# Patient Record
Sex: Female | Born: 1980 | Hispanic: No | Marital: Single | Smoking: Never smoker
Health system: Southern US, Community
[De-identification: ages and names within clinical notes are randomized; demographics above are authoritative.]

## PROBLEM LIST (undated history)

## (undated) ENCOUNTER — Inpatient Hospital Stay (HOSPITAL_COMMUNITY): Payer: Self-pay

## (undated) DIAGNOSIS — I10 Essential (primary) hypertension: Secondary | ICD-10-CM

## (undated) DIAGNOSIS — O139 Gestational [pregnancy-induced] hypertension without significant proteinuria, unspecified trimester: Secondary | ICD-10-CM

## (undated) HISTORY — PX: KNEE ARTHROSCOPY W/ ACL RECONSTRUCTION: SHX1858

## (undated) HISTORY — PX: BREAST LUMPECTOMY: SHX2

## (undated) HISTORY — PX: HERNIA REPAIR: SHX51

---

## 2003-06-07 ENCOUNTER — Other Ambulatory Visit: Admission: RE | Admit: 2003-06-07 | Discharge: 2003-06-07 | Payer: Self-pay | Admitting: Gynecology

## 2016-02-14 ENCOUNTER — Other Ambulatory Visit (HOSPITAL_COMMUNITY): Payer: Self-pay | Admitting: Obstetrics and Gynecology

## 2016-02-14 DIAGNOSIS — Z3689 Encounter for other specified antenatal screening: Secondary | ICD-10-CM

## 2016-02-14 DIAGNOSIS — O26843 Uterine size-date discrepancy, third trimester: Secondary | ICD-10-CM

## 2016-02-14 DIAGNOSIS — Z3A33 33 weeks gestation of pregnancy: Secondary | ICD-10-CM

## 2016-02-21 ENCOUNTER — Other Ambulatory Visit (HOSPITAL_COMMUNITY): Payer: Self-pay | Admitting: Obstetrics and Gynecology

## 2016-02-21 ENCOUNTER — Ambulatory Visit (HOSPITAL_COMMUNITY)
Admission: RE | Admit: 2016-02-21 | Discharge: 2016-02-21 | Disposition: A | Discharge status: Home / Self Care | Payer: BLUE CROSS/BLUE SHIELD | Source: Ambulatory Visit | Attending: Obstetrics and Gynecology | Admitting: Obstetrics and Gynecology

## 2016-02-21 ENCOUNTER — Encounter (HOSPITAL_COMMUNITY): Payer: Self-pay

## 2016-02-21 DIAGNOSIS — O365930 Maternal care for other known or suspected poor fetal growth, third trimester, not applicable or unspecified: Secondary | ICD-10-CM | POA: Diagnosis present

## 2016-02-21 DIAGNOSIS — Z3A34 34 weeks gestation of pregnancy: Secondary | ICD-10-CM

## 2016-02-21 DIAGNOSIS — IMO0002 Reserved for concepts with insufficient information to code with codable children: Secondary | ICD-10-CM

## 2016-02-21 DIAGNOSIS — Z3689 Encounter for other specified antenatal screening: Secondary | ICD-10-CM

## 2016-02-21 DIAGNOSIS — O26843 Uterine size-date discrepancy, third trimester: Secondary | ICD-10-CM

## 2016-02-21 DIAGNOSIS — Z3A33 33 weeks gestation of pregnancy: Secondary | ICD-10-CM

## 2016-02-21 NOTE — Progress Notes (Signed)
MFM Consult:  Impressions:  SIUP at [redacted]w[redacted]d active singleton fetus EFW 19th%'le with AC 7th%'le UA Doppler S/D is abnormally elevated (+2SD) but no AEDF/REDF BPP 8/8 no apparent structural defects no previa AFI is normal Constellation c/w IUGR complicated by abnormal UA Doppler studies.  Recommendations: Given IUGR complicated by abnormal UA Doppler studies: 1. weekly UA Doppler/AFI/BPP 2. fetal kick counts 3. delivery 36-37 weeks  Time Spent:  I spent in excess of 30 minutes in consultation with this patient to review records, evaluate her case, and provide her with an adequate discussion and education. More than 50% of this time was spent in direct face-to-face counseling.   It was a pleasure seeing your patient in the office today. Thank you for consultation. Please do not hesitate to contact our service for any further questions.   Thank you,   Louann Sjogren Gaynelle Arabian, Louann Sjogren, MD, MS, FACOG  Assistant Professor  Section of Maternal-Fetal Medicine  [facility]

## 2016-02-24 ENCOUNTER — Inpatient Hospital Stay (HOSPITAL_COMMUNITY): Payer: BLUE CROSS/BLUE SHIELD

## 2016-02-24 ENCOUNTER — Other Ambulatory Visit (HOSPITAL_COMMUNITY): Payer: Self-pay

## 2016-02-24 ENCOUNTER — Encounter (HOSPITAL_COMMUNITY): Payer: Self-pay | Admitting: *Deleted

## 2016-02-24 ENCOUNTER — Inpatient Hospital Stay (HOSPITAL_COMMUNITY)
Admission: AD | Admit: 2016-02-24 | Discharge: 2016-02-24 | Disposition: A | Discharge status: Home / Self Care | Payer: BLUE CROSS/BLUE SHIELD | Source: Ambulatory Visit | Attending: Obstetrics and Gynecology | Admitting: Obstetrics and Gynecology

## 2016-02-24 ENCOUNTER — Other Ambulatory Visit (HOSPITAL_COMMUNITY): Payer: Self-pay | Admitting: Radiology

## 2016-02-24 DIAGNOSIS — O10013 Pre-existing essential hypertension complicating pregnancy, third trimester: Secondary | ICD-10-CM | POA: Insufficient documentation

## 2016-02-24 DIAGNOSIS — O368190 Decreased fetal movements, unspecified trimester, not applicable or unspecified: Secondary | ICD-10-CM

## 2016-02-24 DIAGNOSIS — O368130 Decreased fetal movements, third trimester, not applicable or unspecified: Secondary | ICD-10-CM | POA: Diagnosis present

## 2016-02-24 DIAGNOSIS — O1490 Unspecified pre-eclampsia, unspecified trimester: Secondary | ICD-10-CM

## 2016-02-24 DIAGNOSIS — Z3A34 34 weeks gestation of pregnancy: Secondary | ICD-10-CM | POA: Insufficient documentation

## 2016-02-24 DIAGNOSIS — O113 Pre-existing hypertension with pre-eclampsia, third trimester: Secondary | ICD-10-CM | POA: Diagnosis not present

## 2016-02-24 HISTORY — DX: Gestational (pregnancy-induced) hypertension without significant proteinuria, unspecified trimester: O13.9

## 2016-02-24 HISTORY — DX: Essential (primary) hypertension: I10

## 2016-02-24 LAB — COMPREHENSIVE METABOLIC PANEL
ALK PHOS: 753 U/L — AB (ref 38–126)
ALT: 10 U/L — AB (ref 14–54)
AST: 17 U/L (ref 15–41)
Albumin: 2.9 g/dL — ABNORMAL LOW (ref 3.5–5.0)
Anion gap: 7 (ref 5–15)
BUN: 9 mg/dL (ref 6–20)
CALCIUM: 8.8 mg/dL — AB (ref 8.9–10.3)
CHLORIDE: 105 mmol/L (ref 101–111)
CO2: 23 mmol/L (ref 22–32)
CREATININE: 0.8 mg/dL (ref 0.44–1.00)
Glucose, Bld: 103 mg/dL — ABNORMAL HIGH (ref 65–99)
Potassium: 3.9 mmol/L (ref 3.5–5.1)
SODIUM: 135 mmol/L (ref 135–145)
Total Bilirubin: 0.2 mg/dL — ABNORMAL LOW (ref 0.3–1.2)
Total Protein: 6.2 g/dL — ABNORMAL LOW (ref 6.5–8.1)

## 2016-02-24 LAB — CBC
HCT: 30.4 % — ABNORMAL LOW (ref 36.0–46.0)
HEMOGLOBIN: 10.7 g/dL — AB (ref 12.0–15.0)
MCH: 29.4 pg (ref 26.0–34.0)
MCHC: 35.2 g/dL (ref 30.0–36.0)
MCV: 83.5 fL (ref 78.0–100.0)
PLATELETS: 208 10*3/uL (ref 150–400)
RBC: 3.64 MIL/uL — AB (ref 3.87–5.11)
RDW: 13.2 % (ref 11.5–15.5)
WBC: 8.8 10*3/uL (ref 4.0–10.5)

## 2016-02-24 LAB — PROTEIN / CREATININE RATIO, URINE
Creatinine, Urine: 136 mg/dL
Protein Creatinine Ratio: 0.75 mg/mg{Cre} — ABNORMAL HIGH (ref 0.00–0.15)
Total Protein, Urine: 102 mg/dL

## 2016-02-24 MED ORDER — BETAMETHASONE SOD PHOS & ACET 6 (3-3) MG/ML IJ SUSP
12.0000 mg | Freq: Once | INTRAMUSCULAR | Status: AC
  Administered 2016-02-24: 12 mg via INTRAMUSCULAR
  Filled 2016-02-24: qty 2

## 2016-02-24 NOTE — MAU Provider Note (Signed)
History     CSN: [id number]  Arrival date and time: 02/24/16 1205   None     Chief Complaint  Patient presents with  . Decreased Fetal Movement   HPI   Sarah Sexton is a 34yo [id number] at [redacted]w[redacted]d presenting with 7 hour history of decreased fetal movement. Per patient, her previous prenatal visit was 3 days ago and they told her something might be wrong with the placenta. Per chart review, recent U/S showed IUGR and abnormal UA doppler. She was advised to do fetal kick counts every two hours and has not felt baby move since 0530 today. No contractions, LOF, or VB.   Denies h/a, abdominal pain, edema, vomiting, vision changes.   Patient has history of preterm delivery at 36 weeks for preeclampsia and one miscarriage.    Past Medical History:  Diagnosis Date  . Hypertension    elevated 1-52yrs after del/pre-eclampsia  . Pregnancy induced hypertension     Past Surgical History:  Procedure Laterality Date  . BREAST LUMPECTOMY Left   . HERNIA REPAIR Right   . KNEE ARTHROSCOPY W/ ACL RECONSTRUCTION      Family History  Problem Relation Age of Onset  . Hypertension Mother   . Diabetes Mother   . Hyperlipidemia Mother     Social History  Substance Use Topics  . Smoking status: Never Smoker  . Smokeless tobacco: Never Used  . Alcohol use No    Allergies: No Known Allergies  Prescriptions Prior to Admission  Medication Sig Dispense Refill Last Dose  . calcium carbonate (TUMS) 500 MG chewable tablet Chew 1 tablet by mouth daily.   Taking  . ferrous sulfate 325 (65 FE) MG tablet Take 325 mg by mouth daily with breakfast.     . Prenatal Vit w/Fe-Methylfol-FA (PNV PO) Take by mouth.       Review of Systems  Constitutional: Negative for fever.  Eyes: Negative for blurred vision.  Cardiovascular: Negative for leg swelling.  Gastrointestinal: Negative for abdominal pain and vomiting.  Neurological: Negative for headaches.   Physical Exam   Blood pressure 144/93, pulse  65, temperature 98.2 F (36.8 C), temperature source Oral, resp. rate 18.  Physical Exam  Constitutional: She appears well-developed and well-nourished.  Cardiovascular: Normal rate.   Respiratory: Effort normal.  GI: There is no tenderness.  Gravid  Musculoskeletal: She exhibits no edema.  Psychiatric: She has a normal mood and affect.    MAU Course  US FETAL BPP WO NON STRESS Date/Time: 02/24/2016 12:56 PM Performed by: Catalina Antigua Authorized by: Catalina Antigua   [place] MFM UA CORD DOPPLER Date/Time: 02/24/2016 12:57 PM Performed by: Catalina Antigua Authorized by: Catalina Antigua     Results for orders placed or performed during the hospital encounter of 02/24/16 (from the past 24 hour(s))  CBC     Status: Abnormal   Collection Time: 02/24/16 12:45 PM  Result Value Ref Range   WBC 8.8 4.0 - 10.5 K/uL   RBC 3.64 (L) 3.87 - 5.11 MIL/uL   Hemoglobin 10.7 (L) 12.0 - 15.0 g/dL   HCT 10.2 (L) 72.5 - 36.6 %   MCV 83.5 78.0 - 100.0 fL   MCH 29.4 26.0 - 34.0 pg   MCHC 35.2 30.0 - 36.0 g/dL   RDW 44.0 34.7 - 42.5 %   Platelets 208 150 - 400 K/uL  Comprehensive metabolic panel     Status: Abnormal   Collection Time: 02/24/16 12:45 PM  Result Value Ref Range   Sodium  135 135 - 145 mmol/L   Potassium 3.9 3.5 - 5.1 mmol/L   Chloride 105 101 - 111 mmol/L   CO2 23 22 - 32 mmol/L   Glucose, Bld 103 (H) 65 - 99 mg/dL   BUN 9 6 - 20 mg/dL   Creatinine, Ser 1.61 0.44 - 1.00 mg/dL   Calcium 8.8 (L) 8.9 - 10.3 mg/dL   Total Protein 6.2 (L) 6.5 - 8.1 g/dL   Albumin 2.9 (L) 3.5 - 5.0 g/dL   AST 17 15 - 41 U/L   ALT 10 (L) 14 - 54 U/L   Alkaline Phosphatase 753 (H) 38 - 126 U/L   Total Bilirubin 0.2 (L) 0.3 - 1.2 mg/dL   GFR calc non Af Amer >60 >60 mL/min   GFR calc Af Amer >60 >60 mL/min   Anion gap 7 5 - 15  Protein / creatinine ratio, urine     Status: Abnormal   Collection Time: 02/24/16  1:40 PM  Result Value Ref Range   Creatinine, Urine 136.00 mg/dL   Total Protein,  Urine 102 mg/dL   Protein Creatinine Ratio 0.75 (H) 0.00 - 0.15 mg/mg[Cre]     MDM Patient reassured of reactive fetal strip. Sent to U/S. Informed of unchanged BPP and doppler. Diagnosed with mild preeclampsia. Given first dose of BMZ. Informed of plan to induce at 36-37 weeks.   Assessment and Plan  Sarah Sexton is a 34yo [id number] at [redacted]w[redacted]d presenting with 7 hour history of decreased fetal movement. - BPP and cord doppler: no change from previous imaging repeat on Friday.  - HTN: diagnosed with mild preeclampsia (P:C 0.75). Will give first dose of BMZ. - Plan to induce at 36-37 weeks per U/S report.  - f/u with OB clinic this week  [name] 02/24/2016, 12:45 PM    OB FELLOW MAU NOTE  I have seen and examined this patient; I agree with above documentation in the studentst's note.    [name] 02/24/2016, 3:51 PM

## 2016-02-24 NOTE — Discharge Instructions (Signed)
Preeclampsia and Eclampsia °Preeclampsia is a serious condition that develops only during pregnancy. It is also called toxemia of pregnancy. This condition causes high blood pressure along with other symptoms, such as swelling and headaches. These may develop as the condition gets worse. Preeclampsia may occur 20 weeks or later into your pregnancy.  °Diagnosing and treating preeclampsia early is very important. If not treated early, it can cause serious problems for you and your baby. One problem it can lead to is eclampsia, which is a condition that causes muscle jerking or shaking (convulsions) in the mother. Delivering your baby is the best treatment for preeclampsia or eclampsia.  °RISK FACTORS °The cause of preeclampsia is not known. You may be more likely to develop preeclampsia if you have certain risk factors. These include:  °· Being pregnant for the first time. °· Having preeclampsia in a past pregnancy. °· Having a family history of preeclampsia. °· Having high blood pressure. °· Being pregnant with twins or triplets. °· Being 35 or older. °· Being African American. °· Having kidney disease or diabetes. °· Having medical conditions such as lupus or blood diseases. °· Being very overweight (obese). °SIGNS AND SYMPTOMS  °The earliest signs of preeclampsia are: °· High blood pressure. °· Increased protein in your urine. Your health care provider will check for this at every prenatal visit. °Other symptoms that can develop include:  °· Severe headaches. °· Sudden weight gain. °· Swelling of your hands, face, legs, and feet. °· Feeling sick to your stomach (nauseous) and throwing up (vomiting). °· Vision problems (blurred or double vision). °· Numbness in your face, arms, legs, and feet. °· Dizziness. °· Slurred speech. °· Sensitivity to bright lights. °· Abdominal pain. °DIAGNOSIS  °There are no screening tests for preeclampsia. Your health care provider will ask you about symptoms and check for signs of  preeclampsia during your prenatal visits. You may also have tests, including: °· Urine testing. °· Blood testing. °· Checking your baby's heart rate. °· Checking the health of your baby and your placenta using images created with sound waves (ultrasound). °TREATMENT  °You can work out the best treatment approach together with your health care provider. It is very important to keep all prenatal appointments. If you have an increased risk of preeclampsia, you may need more frequent prenatal exams. °· Your health care provider may prescribe bed rest. °· You may have to eat as little salt as possible. °· You may need to take medicine to lower your blood pressure if the condition does not respond to more conservative measures. °· You may need to stay in the hospital if your condition is severe. There, treatment will focus on controlling your blood pressure and fluid retention. You may also need to take medicine to prevent seizures. °· If the condition gets worse, your baby may need to be delivered early to protect you and the baby. You may have your labor started with medicine (be induced), or you may have a cesarean delivery. °· Preeclampsia usually goes away after the baby is born. °HOME CARE INSTRUCTIONS  °· Only take over-the-counter or prescription medicines as directed by your health care provider. °· Lie on your left side while resting. This keeps pressure off your baby. °· Elevate your feet while resting. °· Get regular exercise. Ask your health care provider what type of exercise is safe for you. °· Avoid caffeine and alcohol. °· Do not smoke. °· Drink 6-8 glasses of water every day. °· Eat a balanced diet   that is low in salt. Do not add salt to your food.  Avoid stressful situations as much as possible.  Get plenty of rest and sleep.  Keep all prenatal appointments and tests as scheduled. SEEK MEDICAL CARE IF:  You are gaining more weight than expected.  You have any headaches, abdominal pain, or  nausea.  You are bruising more than usual.  You feel dizzy or light-headed. SEEK IMMEDIATE MEDICAL CARE IF:   You develop sudden or severe swelling anywhere in your body. This usually happens in the legs.  You gain 5 lb (2.3 kg) or more in a week.  You have a severe headache, dizziness, problems with your vision, or confusion.  You have severe abdominal pain.  You have lasting nausea or vomiting.  You have a seizure.  You have trouble moving any part of your body.  You develop numbness in your body.  You have trouble speaking.  You have any abnormal bleeding.  You develop a stiff neck.  You pass out. MAKE SURE YOU:   Understand these instructions.  Will watch your condition.  Will get help right away if you are not doing well or get worse.   This information is not intended to replace advice given to you by your health care provider. Make sure you discuss any questions you have with your health care provider.   Document Released: 07/10/2000 Document Revised: 07/18/2013 Document Reviewed: 05/05/2013 Elsevier Interactive Patient Education 2016 [employer] Fetal Movement Counts Patient Name: __________________________________________________ Patient Due Date: ____________________ Performing a fetal movement count is highly recommended in high-risk pregnancies, but it is good for every pregnant woman to do. Your health care provider may ask you to start counting fetal movements at 28 weeks of the pregnancy. Fetal movements often increase:  After eating a full meal.  After physical activity.  After eating or drinking something sweet or cold.  At rest. Pay attention to when you feel the baby is most active. This will help you notice a pattern of your baby's sleep and wake cycles and what factors contribute to an increase in fetal movement. It is important to perform a fetal movement count at the same time each day when your baby is normally most active.  HOW TO  COUNT FETAL MOVEMENTS 1. Find a quiet and comfortable area to sit or lie down on your left side. Lying on your left side provides the best blood and oxygen circulation to your baby. 2. Write down the day and time on a sheet of paper or in a journal. 3. Start counting kicks, flutters, swishes, rolls, or jabs in a 2-hour period. You should feel at least 10 movements within 2 hours. 4. If you do not feel 10 movements in 2 hours, wait 2-3 hours and count again. Look for a change in the pattern or not enough counts in 2 hours. SEEK MEDICAL CARE IF:  You feel less than 10 counts in 2 hours, tried twice.  There is no movement in over an hour.  The pattern is changing or taking longer each day to reach 10 counts in 2 hours.  You feel the baby is not moving as he or she usually does. Date: ____________ Movements: ____________ Start time: ____________ Doreatha Martin time: ____________  Date: ____________ Movements: ____________ Start time: ____________ Doreatha Martin time: ____________ Date: ____________ Movements: ____________ Start time: ____________ Doreatha Martin time: ____________ Date: ____________ Movements: ____________ Start time: ____________ Doreatha Martin time: ____________ Date: ____________ Movements: ____________ Start time: ____________ Doreatha Martin time: ____________ Date:  ____________ Movements: ____________ Start time: ____________ Finish time: ____________ °Date: ____________ Movements: ____________ Start time: ____________ Finish time: ____________ °Date: ____________ Movements: ____________ Start time: ____________ Finish time: ____________  °Date: ____________ Movements: ____________ Start time: ____________ Finish time: ____________ °Date: ____________ Movements: ____________ Start time: ____________ Finish time: ____________ °Date: ____________ Movements: ____________ Start time: ____________ Finish time: ____________ °Date: ____________ Movements: ____________ Start time: ____________ Finish time: ____________ °Date:  ____________ Movements: ____________ Start time: ____________ Finish time: ____________ °Date: ____________ Movements: ____________ Start time: ____________ Finish time: ____________ °Date: ____________ Movements: ____________ Start time: ____________ Finish time: ____________  °Date: ____________ Movements: ____________ Start time: ____________ Finish time: ____________ °Date: ____________ Movements: ____________ Start time: ____________ Finish time: ____________ °Date: ____________ Movements: ____________ Start time: ____________ Finish time: ____________ °Date: ____________ Movements: ____________ Start time: ____________ Finish time: ____________ °Date: ____________ Movements: ____________ Start time: ____________ Finish time: ____________ °Date: ____________ Movements: ____________ Start time: ____________ Finish time: ____________ °Date: ____________ Movements: ____________ Start time: ____________ Finish time: ____________  °Date: ____________ Movements: ____________ Start time: ____________ Finish time: ____________ °Date: ____________ Movements: ____________ Start time: ____________ Finish time: ____________ °Date: ____________ Movements: ____________ Start time: ____________ Finish time: ____________ °Date: ____________ Movements: ____________ Start time: ____________ Finish time: ____________ °Date: ____________ Movements: ____________ Start time: ____________ Finish time: ____________ °Date: ____________ Movements: ____________ Start time: ____________ Finish time: ____________ °Date: ____________ Movements: ____________ Start time: ____________ Finish time: ____________  °Date: ____________ Movements: ____________ Start time: ____________ Finish time: ____________ °Date: ____________ Movements: ____________ Start time: ____________ Finish time: ____________ °Date: ____________ Movements: ____________ Start time: ____________ Finish time: ____________ °Date: ____________ Movements: ____________ Start  time: ____________ Finish time: ____________ °Date: ____________ Movements: ____________ Start time: ____________ Finish time: ____________ °Date: ____________ Movements: ____________ Start time: ____________ Finish time: ____________ °Date: ____________ Movements: ____________ Start time: ____________ Finish time: ____________  °Date: ____________ Movements: ____________ Start time: ____________ Finish time: ____________ °Date: ____________ Movements: ____________ Start time: ____________ Finish time: ____________ °Date: ____________ Movements: ____________ Start time: ____________ Finish time: ____________ °Date: ____________ Movements: ____________ Start time: ____________ Finish time: ____________ °Date: ____________ Movements: ____________ Start time: ____________ Finish time: ____________ °Date: ____________ Movements: ____________ Start time: ____________ Finish time: ____________ °Date: ____________ Movements: ____________ Start time: ____________ Finish time: ____________  °Date: ____________ Movements: ____________ Start time: ____________ Finish time: ____________ °Date: ____________ Movements: ____________ Start time: ____________ Finish time: ____________ °Date: ____________ Movements: ____________ Start time: ____________ Finish time: ____________ °Date: ____________ Movements: ____________ Start time: ____________ Finish time: ____________ °Date: ____________ Movements: ____________ Start time: ____________ Finish time: ____________ °Date: ____________ Movements: ____________ Start time: ____________ Finish time: ____________ °Date: ____________ Movements: ____________ Start time: ____________ Finish time: ____________  °Date: ____________ Movements: ____________ Start time: ____________ Finish time: ____________ °Date: ____________ Movements: ____________ Start time: ____________ Finish time: ____________ °Date: ____________ Movements: ____________ Start time: ____________ Finish time:  ____________ °Date: ____________ Movements: ____________ Start time: ____________ Finish time: ____________ °Date: ____________ Movements: ____________ Start time: ____________ Finish time: ____________ °Date: ____________ Movements: ____________ Start time: ____________ Finish time: ____________ °  °This information is not intended to replace advice given to you by your health care provider. Make sure you discuss any questions you have with your health care provider. °  °Document Released: 08/12/2006 Document Revised: 08/03/2014 Document Reviewed: 05/09/2012 °Elsevier Interactive Patient Education ©2016 [employer] Inc[employer] ° °

## 2016-02-24 NOTE — MAU Note (Signed)
Baby just not moving as much today.  Was at MFM last week, Dr Shawnie Pons sent her- for dopplers, ? IUGR

## 2016-02-25 ENCOUNTER — Inpatient Hospital Stay (HOSPITAL_COMMUNITY)
Admission: AD | Admit: 2016-02-25 | Discharge: 2016-02-25 | Disposition: A | Discharge status: Home / Self Care | Payer: BLUE CROSS/BLUE SHIELD | Source: Ambulatory Visit | Attending: Obstetrics & Gynecology | Admitting: Obstetrics & Gynecology

## 2016-02-25 DIAGNOSIS — Z3A34 34 weeks gestation of pregnancy: Secondary | ICD-10-CM | POA: Diagnosis not present

## 2016-02-25 MED ORDER — BETAMETHASONE SOD PHOS & ACET 6 (3-3) MG/ML IJ SUSP
12.0000 mg | Freq: Once | INTRAMUSCULAR | Status: AC
  Administered 2016-02-25: 12 mg via INTRAMUSCULAR
  Filled 2016-02-25: qty 2

## 2016-02-28 ENCOUNTER — Encounter (HOSPITAL_COMMUNITY): Payer: Self-pay

## 2016-02-28 ENCOUNTER — Ambulatory Visit (HOSPITAL_COMMUNITY)
Admission: RE | Admit: 2016-02-28 | Discharge: 2016-02-28 | Disposition: A | Discharge status: Home / Self Care | Payer: BLUE CROSS/BLUE SHIELD | Source: Ambulatory Visit | Attending: Obstetrics and Gynecology | Admitting: Obstetrics and Gynecology

## 2016-02-28 DIAGNOSIS — Z3A35 35 weeks gestation of pregnancy: Secondary | ICD-10-CM | POA: Insufficient documentation

## 2016-02-28 DIAGNOSIS — O09293 Supervision of pregnancy with other poor reproductive or obstetric history, third trimester: Secondary | ICD-10-CM | POA: Insufficient documentation

## 2016-02-28 DIAGNOSIS — O365930 Maternal care for other known or suspected poor fetal growth, third trimester, not applicable or unspecified: Secondary | ICD-10-CM | POA: Insufficient documentation

## 2016-02-28 DIAGNOSIS — O09213 Supervision of pregnancy with history of pre-term labor, third trimester: Secondary | ICD-10-CM | POA: Diagnosis not present

## 2016-02-28 DIAGNOSIS — O26843 Uterine size-date discrepancy, third trimester: Secondary | ICD-10-CM

## 2016-03-06 ENCOUNTER — Ambulatory Visit (HOSPITAL_COMMUNITY): Payer: BLUE CROSS/BLUE SHIELD

## 2016-12-26 ENCOUNTER — Encounter (HOSPITAL_COMMUNITY): Payer: Self-pay

## 2020-07-16 ENCOUNTER — Telehealth: Payer: Self-pay | Admitting: Lactation Services

## 2020-07-16 NOTE — Telephone Encounter (Signed)
Patient called with concerns that infant will not latch. She is pumping. She is concerned about Mastitis. Infant was born at 18 weeks and has never latched.   She reports she is pumping. She has had lumps in the breast and was having chills.   She does not usually latch her infants to the breast.   She is not latching infant as she does not know how to latch infant.   She reports her pumping is very inconsistent. She is trying to pump when infant is feeding. She pumps every 1-3  Hours. She is using a Medela PIS.  She has some discomfort with pumping. She is having pain to outer aspect of the nipple. She reports her areola/nipple is pulling into the flange. She reports her nipples are the diameter of a nipple is between a dime and a nipple in diameter. She is starting the suction on low and increasing as can tolerate. She is getting 2-3 ounces per breast with each pumping.   Infant is eating about 3.5 ounces with each feeding, mom has enough to feed infant. She is using formula to supplement as needed.   She reports that some of the lumps will go away with pumping. She is massaging and hand expressing and some go away and some do not. She is not wearing a bra as they do not fit. She is planning to purchase one. She reports she does feel better since removing the bra.   She does have some painful area. Her nipples are hurting all the time. Reviewed it may be friction and improper flange fit. Reviewed she can come into the office for flange fitting and latching with infant. Reviewed how flange should fit.   Reviewed keeping suction at comfortable level, lubricating nipples prior to pumping.  Apply warm compresses for 20 minutes prior to pumping. Reviewed hand expressing post pumping.   She also has soaked her breast in a bowl or warm water and reports that did help.   She feels stressed, reviewed relaxation with pumping.   Reviewed Sunflower Lecithin, purpose and dosages.   Mom is planning to  go back to work soon, she does not feel like she wants to latch infant. Mom to call back with any questions or concerns as needed.

## 2021-06-08 ENCOUNTER — Emergency Department: Payer: BC Managed Care – PPO

## 2021-06-08 ENCOUNTER — Other Ambulatory Visit: Payer: Self-pay

## 2021-06-08 ENCOUNTER — Emergency Department
Admission: EM | Admit: 2021-06-08 | Discharge: 2021-06-08 | Disposition: A | Discharge status: Home / Self Care | Payer: BC Managed Care – PPO | Attending: Emergency Medicine | Admitting: Emergency Medicine

## 2021-06-08 DIAGNOSIS — R112 Nausea with vomiting, unspecified: Secondary | ICD-10-CM | POA: Insufficient documentation

## 2021-06-08 DIAGNOSIS — R42 Dizziness and giddiness: Secondary | ICD-10-CM

## 2021-06-08 DIAGNOSIS — Z20822 Contact with and (suspected) exposure to covid-19: Secondary | ICD-10-CM | POA: Diagnosis not present

## 2021-06-08 DIAGNOSIS — I10 Essential (primary) hypertension: Secondary | ICD-10-CM | POA: Insufficient documentation

## 2021-06-08 DIAGNOSIS — R110 Nausea: Secondary | ICD-10-CM

## 2021-06-08 LAB — BASIC METABOLIC PANEL
Anion gap: 6 (ref 5–15)
BUN: 10 mg/dL (ref 6–20)
CO2: 26 mmol/L (ref 22–32)
Calcium: 9.1 mg/dL (ref 8.9–10.3)
Chloride: 107 mmol/L (ref 98–111)
Creatinine, Ser: 0.78 mg/dL (ref 0.44–1.00)
GFR, Estimated: >60 mL/min (ref >60)
Glucose, Bld: 100 mg/dL — ABNORMAL HIGH (ref 70–99)
Potassium: 4.1 mmol/L (ref 3.5–5.1)
Sodium: 139 mmol/L (ref 135–145)

## 2021-06-08 LAB — CBC
HCT: 33.5 % — ABNORMAL LOW (ref 36.0–46.0)
Hemoglobin: 11.3 g/dL — ABNORMAL LOW (ref 12.0–15.0)
MCH: 29 pg (ref 26.0–34.0)
MCHC: 33.7 g/dL (ref 30.0–36.0)
MCV: 85.9 fL (ref 80.0–100.0)
Platelets: 257 10*3/uL (ref 150–400)
RBC: 3.9 MIL/uL (ref 3.87–5.11)
RDW: 12.8 % (ref 11.5–15.5)
WBC: 4.9 10*3/uL (ref 4.0–10.5)
nRBC: 0 % (ref 0.0–0.2)

## 2021-06-08 LAB — RESP PANEL BY RT-PCR (FLU A&B, COVID) ARPGX2
Influenza A by PCR: NEGATIVE
Influenza B by PCR: NEGATIVE
SARS Coronavirus 2 by RT PCR: NEGATIVE

## 2021-06-08 MED ORDER — ONDANSETRON 4 MG PO TBDP: 4.0000 mg | ORAL_TABLET | Freq: Three times a day (TID) | ORAL | 0 refills | Status: AC | PRN

## 2021-06-08 NOTE — ED Triage Notes (Signed)
Pt c/o waking with dizziness with N/V today, states ti happened a couple of weeks ago and went away after about 2 days, denies pain or other sx

## 2021-06-08 NOTE — Discharge Instructions (Signed)
Check your MyChart results for your COVID test results.  You Zofran as needed for nausea.  Please follow-up with your PCP.  Discuss a referral to ENT.  If you develop any dizziness that will not go away, dizziness and fever or passing out, please return to the ED.

## 2021-06-08 NOTE — ED Provider Notes (Signed)
[facility] Emergency Department Provider Note ____________________________________________   Event Date/Time   First MD Initiated Contact with Patient 06/08/21 1101     (approximate)  I have reviewed the triage vital signs and the nursing notes.  HISTORY  Chief Complaint Dizziness   HPI Sarah Sexton is a 40 y.o. femalewho presents to the ED for evaluation of nausea, vomiting and dizziness.  Chart review indicates hypertension.   Patient presents to the ED for evaluation of a couple episodes of vertiginous dizziness, nausea and vomiting.  Reports this happened a few times in the past couple months, including this morning.  She reports that when she got up from her bed, she felt vertigo and had difficulty getting steady.  Felt nauseous with this.  Denies falls, syncope or injuries.  It is now improving.  Denies any headache, fever, extremity weakness.  She presents today with her daughter who is here for viral URI symptoms for the past couple days.  Past Medical History:  Diagnosis Date   Hypertension    elevated 1-73yrs after del/pre-eclampsia   Pregnancy induced hypertension     Patient Active Problem List   Diagnosis Date Noted   Pre-eclampsia 02/24/2016    Past Surgical History:  Procedure Laterality Date   BREAST LUMPECTOMY Left    HERNIA REPAIR Right    KNEE ARTHROSCOPY W/ ACL RECONSTRUCTION      Prior to Admission medications   Medication Sig Start Date End Date Taking? Authorizing Provider  ondansetron (ZOFRAN ODT) 4 MG disintegrating tablet Take 1 tablet (4 mg total) by mouth every 8 (eight) hours as needed for nausea or vomiting. 06/08/21  Yes Delton Prairie, MD  calcium carbonate (TUMS) 500 MG chewable tablet Chew 1 tablet by mouth daily as needed for indigestion or heartburn.     [provider]  ferrous sulfate 325 (65 FE) MG tablet Take 325 mg by mouth daily with breakfast.    [provider]  Prenatal Vit  w/Fe-Methylfol-FA (PNV PO) Take 1 tablet by mouth daily.     [provider]    Allergies Patient has no known allergies.  Family History  Problem Relation Age of Onset   Hypertension Mother    Diabetes Mother    Hyperlipidemia Mother     Social History Social History   Tobacco Use   Smoking status: Never   Smokeless tobacco: Never  Substance Use Topics   Alcohol use: No   Drug use: No    Review of Systems  Constitutional: No fever/chills Eyes: No visual changes. ENT: No sore throat. Cardiovascular: Denies chest pain. Respiratory: Denies shortness of breath. Gastrointestinal: No abdominal pain. no vomiting.  No diarrhea.  No constipation. Positive for nausea Genitourinary: Negative for dysuria. Musculoskeletal: Negative for back pain. Skin: Negative for rash. Neurological: Negative for headaches, focal weakness or numbness. Positive for vertigo  ____________________________________________   PHYSICAL EXAM:  VITAL SIGNS: Vitals:   06/08/21 1015  BP: (!) 165/104  Pulse: (!) 58  Resp: 17  Temp: 98.4 F (36.9 C)  SpO2: 100%    Constitutional: Alert and oriented. Well appearing and in no acute distress. Eyes: Conjunctivae are normal. PERRL. EOMI. Head: Atraumatic. Right TM is clear without erythema, bulging or purulence. Left TM is more dull without erythema, fluid level or bulging Nose: No congestion/rhinnorhea. Mouth/Throat: Mucous membranes are moist.  Oropharynx non-erythematous. Neck: No stridor. No cervical spine tenderness to palpation. Cardiovascular: Normal rate, regular rhythm. Grossly normal heart sounds.  Good peripheral circulation.  Respiratory: Normal respiratory effort.  No retractions. Lungs CTAB. Gastrointestinal: Soft , nondistended, nontender to palpation. No CVA tenderness. Musculoskeletal: No lower extremity tenderness nor edema.  No joint effusions. No signs of acute trauma. Neurologic:  Normal speech and language. No gross  focal neurologic deficits are appreciated. No gait instability noted. Skin:  Skin is warm, dry and intact. No rash noted. Psychiatric: Mood and affect are normal. Speech and behavior are normal.  ____________________________________________   LABS (all labs ordered are listed, but only abnormal results are displayed)  Labs Reviewed  BASIC METABOLIC PANEL - Abnormal; Notable for the following components:      Result Value   Glucose, Bld 100 (*)    All other components within normal limits  CBC - Abnormal; Notable for the following components:   Hemoglobin 11.3 (*)    HCT 33.5 (*)    All other components within normal limits  RESP PANEL BY RT-PCR (FLU A&B, COVID) [id number]   ____________________________________________  12 Lead EKG  Sinus rhythm with a rate of 61 bpm.  Normal axis and intervals.  Nonspecific ST changes inferiorly and laterally without STEMI or comparison. ____________________________________________  RADIOLOGY  ED MD interpretation: CT head reviewed by me without evidence of acute intracranial pathology.  Official radiology report(s): CT Head Wo Contrast  Result Date: 06/08/2021 CLINICAL DATA:  Syncope. EXAM: CT HEAD WITHOUT CONTRAST TECHNIQUE: Contiguous axial images were obtained from the base of the skull through the vertex without intravenous contrast. COMPARISON:  None. FINDINGS: Brain: No evidence of acute infarction, hemorrhage, hydrocephalus, extra-axial collection or mass lesion/mass effect. Vascular: No hyperdense vessel or unexpected calcification. Skull: Normal. Negative for fracture or focal lesion. Sinuses/Orbits: No acute finding. Other: None. IMPRESSION: No focal acute intracranial abnormality identified. Electronically Signed   By: Sherian Rein M.D.   On: 06/08/2021 10:54    ____________________________________________   PROCEDURES and INTERVENTIONS  Procedure(s) performed (including Critical Care):  Procedures  Medications - No data to  display  ____________________________________________   MDM / ED COURSE   Healthy 40 year old female presents to the ED with positional dizziness and nausea, without evidence of CNS pathology, and amenable to outpatient management.  Reassuring examination without evidence of neurologic or vascular deficits.  Dull left TM in the setting of sick contacts raises possibility of viral URI we will swab her for influenza and COVID.  This could be contributing to her symptoms.  Blood work is benign and CT head without evidence of acute ICH or signs of CVA.  I see no barriers to outpatient management.  We discussed ENT referral for positional dizziness and following up with PCP.  Return precautions for the ED discussed.     ____________________________________________   FINAL CLINICAL IMPRESSION(S) / ED DIAGNOSES  Final diagnoses:  Dizziness  Nausea without vomiting     ED Discharge Orders          Ordered    ondansetron (ZOFRAN ODT) 4 MG disintegrating tablet  Every 8 hours PRN        06/08/21 1121             Dayzee Trower   Note:  This document was prepared using Conservation officer, historic buildings and may include unintentional dictation errors.    Delton Prairie, MD 06/08/21 [phone]

## 2021-12-16 IMAGING — CT CT HEAD W/O CM
3 series · 16 of 47 positions shown, 19 images · non-contrast
Comparison: None.

CLINICAL DATA: Syncope.

EXAM:
CT HEAD WITHOUT CONTRAST
TECHNIQUE: Contiguous axial images were obtained from the base of the skull
through the vertex without intravenous contrast.

[Series 2: head wo · axial · 0.44mm/px · z∈[-147,-17]mm · 10 of 32 slices shown, 13 images]
[im 3/32  brain]
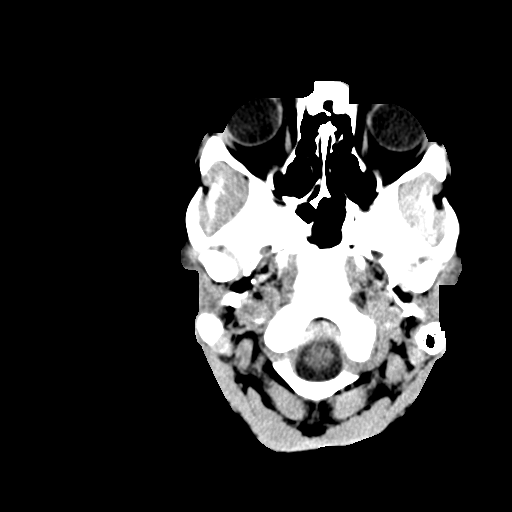
[im 3/32  bone]
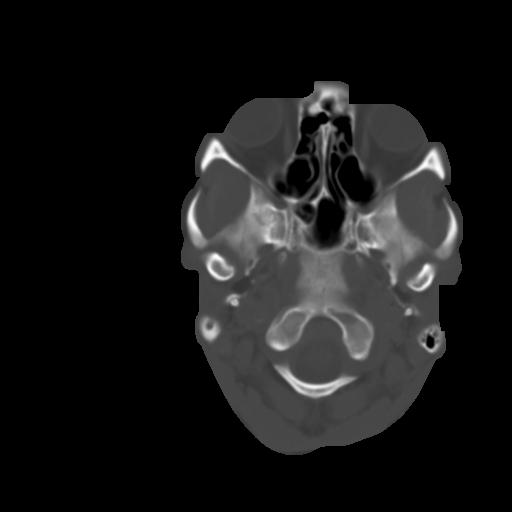
[im 6/32  brain]
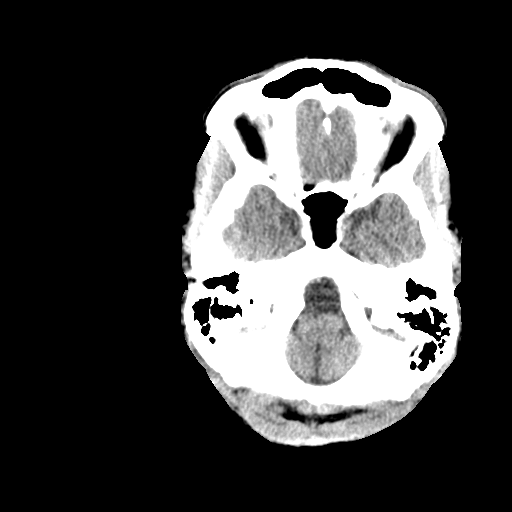
[im 9/32  brain]
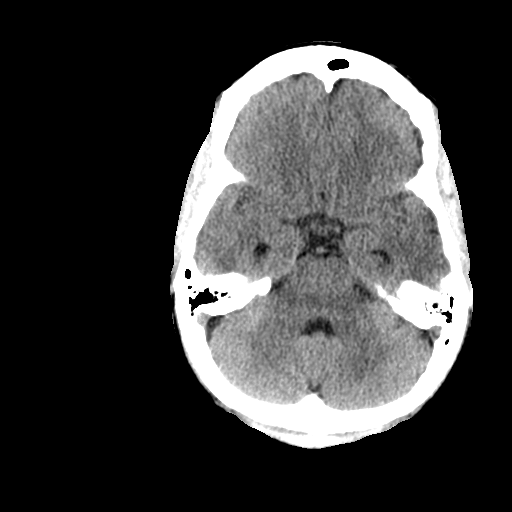
[im 11/32  brain]
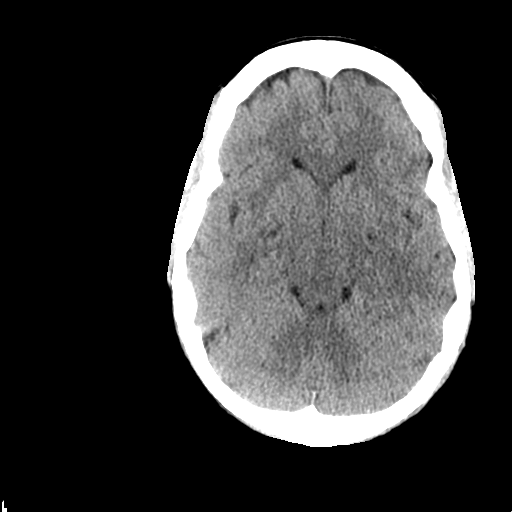
[im 14/32  brain]
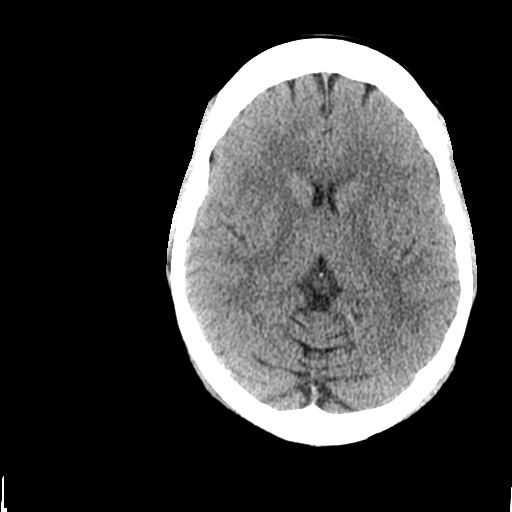
[im 14/32  bone]
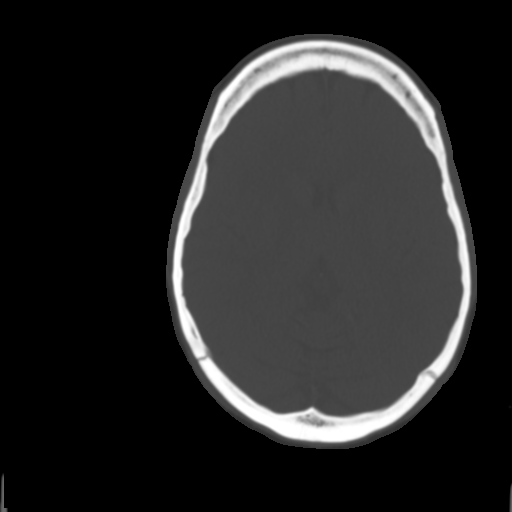
[im 18/32  brain]
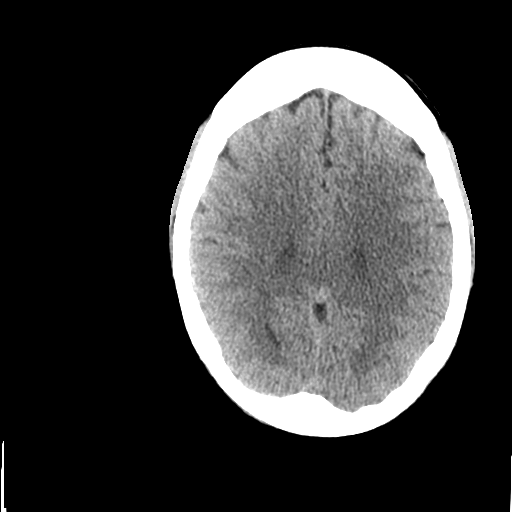
[im 21/32  brain]
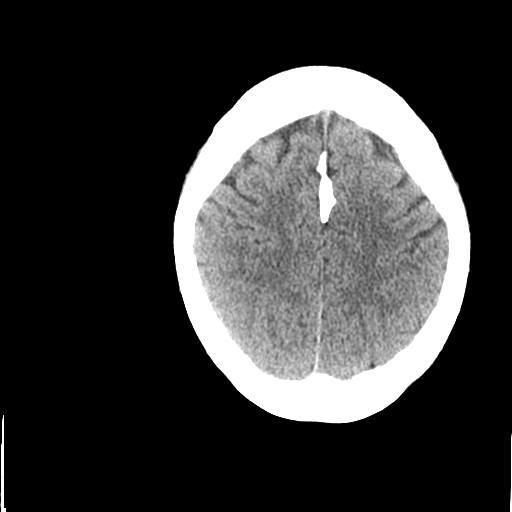
[im 24/32  brain]
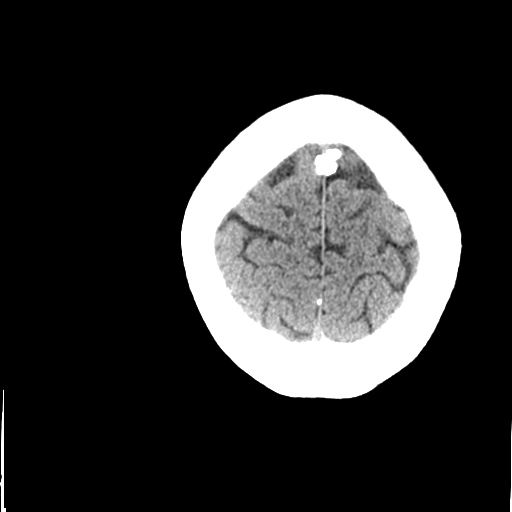
[im 26/32  brain]
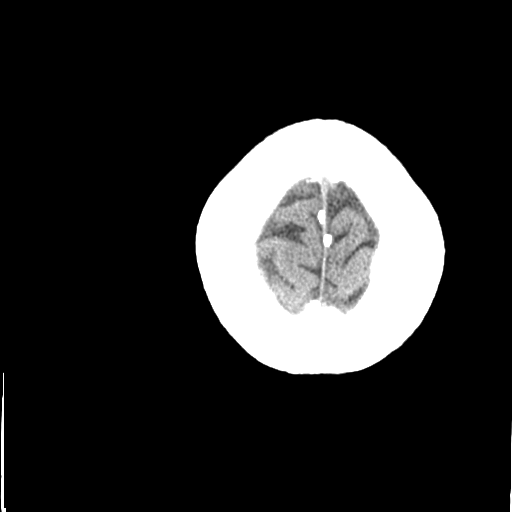
[im 26/32  bone]
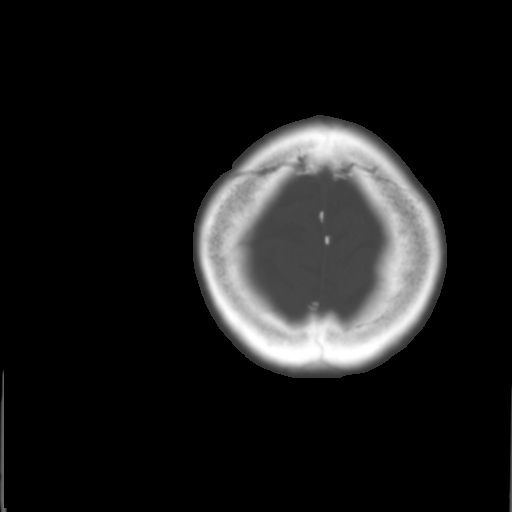
[im 29/32  brain]
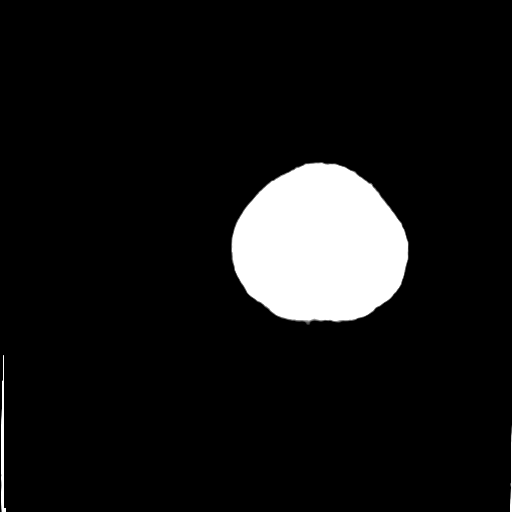

[Series 4: coronal soft tissue · coronal · 0.31mm/px · 3 of 61 slices shown]
[im 21/61  brain]
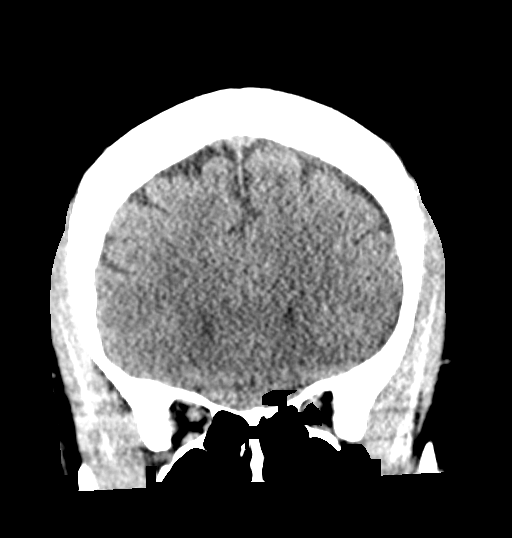
[im 27/61  brain]
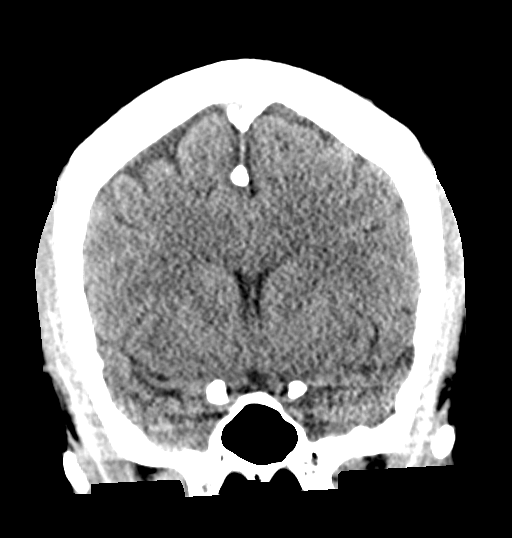
[im 34/61  brain]
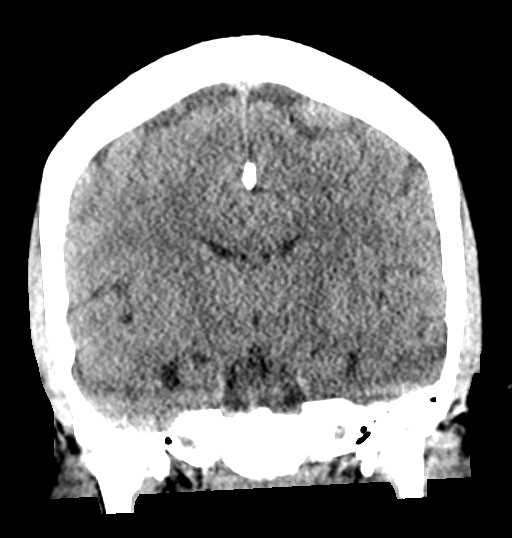

[Series 5: sagittal soft tissue · sagittal · 0.32mm/px · 3 of 54 slices shown]
[im 18/54  brain]
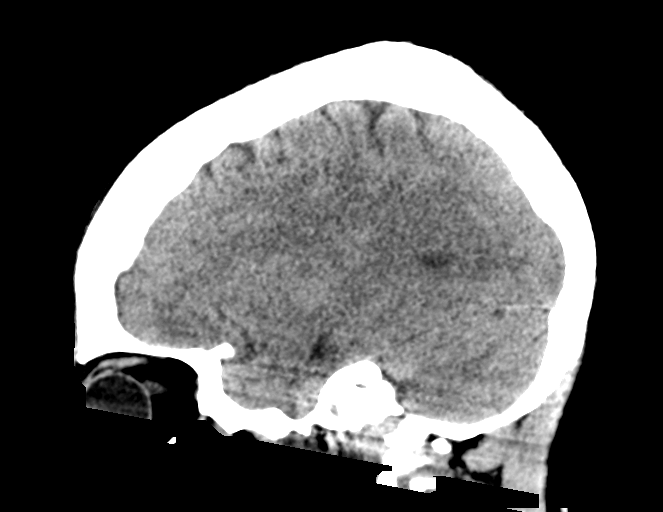
[im 27/54  brain]
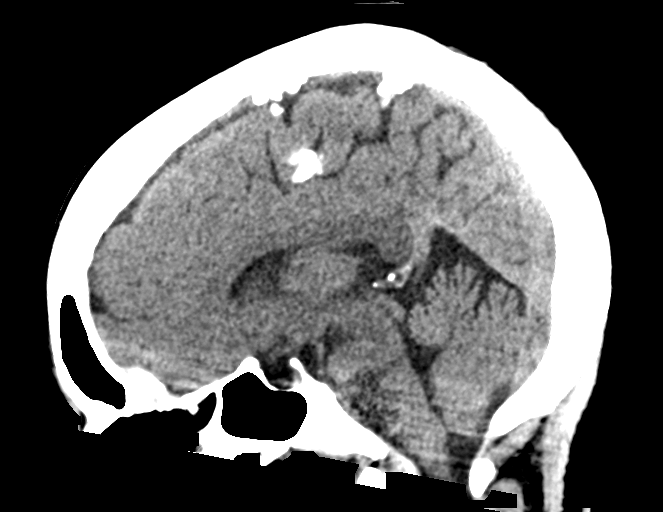
[im 36/54  brain]
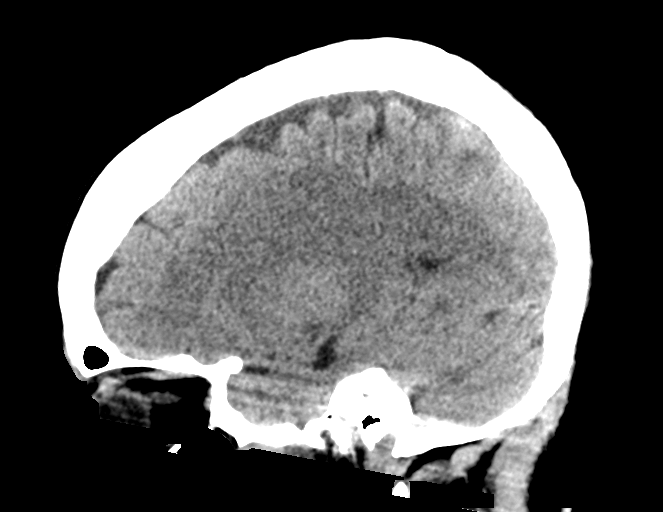

[16 of 47 positions shown; findings below may reference images not displayed]

FINDINGS: Brain: No evidence of acute infarction, hemorrhage, hydrocephalus,
extra-axial collection or mass lesion/mass effect.

Vascular: No hyperdense vessel or unexpected calcification.

Skull: Normal. Negative for fracture or focal lesion.

Sinuses/Orbits: No acute finding.

Other: None.
IMPRESSION: No focal acute intracranial abnormality identified.
